# Patient Record
Sex: Female | Born: 2000 | Hispanic: Yes | Marital: Single | Smoking: Never smoker
Health system: Southern US, Community
[De-identification: ages and names within clinical notes are randomized; demographics above are authoritative.]

## PROBLEM LIST (undated history)

## (undated) DIAGNOSIS — R569 Unspecified convulsions: Secondary | ICD-10-CM

## (undated) DIAGNOSIS — R55 Syncope and collapse: Secondary | ICD-10-CM

## (undated) DIAGNOSIS — F419 Anxiety disorder, unspecified: Secondary | ICD-10-CM

## (undated) HISTORY — DX: Unspecified convulsions: R56.9

## (undated) HISTORY — DX: Anxiety disorder, unspecified: F41.9

## (undated) HISTORY — DX: Syncope and collapse: R55

---
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
## 2022-07-29 ENCOUNTER — Emergency Department (HOSPITAL_COMMUNITY): Payer: Medicaid Other

## 2022-07-29 ENCOUNTER — Encounter (HOSPITAL_COMMUNITY): Payer: Self-pay

## 2022-07-29 ENCOUNTER — Other Ambulatory Visit: Payer: Self-pay

## 2022-07-29 ENCOUNTER — Emergency Department (HOSPITAL_COMMUNITY)
Admission: EM | Admit: 2022-07-29 | Discharge: 2022-07-29 | Disposition: A | Discharge status: Home / Self Care | Payer: Medicaid Other | Attending: Emergency Medicine | Admitting: Emergency Medicine

## 2022-07-29 DIAGNOSIS — R569 Unspecified convulsions: Secondary | ICD-10-CM | POA: Diagnosis not present

## 2022-07-29 DIAGNOSIS — H1131 Conjunctival hemorrhage, right eye: Secondary | ICD-10-CM | POA: Diagnosis not present

## 2022-07-29 DIAGNOSIS — Y9301 Activity, walking, marching and hiking: Secondary | ICD-10-CM | POA: Insufficient documentation

## 2022-07-29 DIAGNOSIS — S0993XA Unspecified injury of face, initial encounter: Secondary | ICD-10-CM | POA: Diagnosis present

## 2022-07-29 DIAGNOSIS — S0081XA Abrasion of other part of head, initial encounter: Secondary | ICD-10-CM | POA: Diagnosis not present

## 2022-07-29 DIAGNOSIS — W01198A Fall on same level from slipping, tripping and stumbling with subsequent striking against other object, initial encounter: Secondary | ICD-10-CM | POA: Diagnosis not present

## 2022-07-29 DIAGNOSIS — D72829 Elevated white blood cell count, unspecified: Secondary | ICD-10-CM | POA: Insufficient documentation

## 2022-07-29 LAB — CBC WITH DIFFERENTIAL/PLATELET
Abs Immature Granulocytes: 0.03 10*3/uL (ref 0.00–0.07)
Basophils Absolute: 0.1 10*3/uL (ref 0.0–0.1)
Basophils Relative: 1 %
Eosinophils Absolute: 0.1 10*3/uL (ref 0.0–0.5)
Eosinophils Relative: 1 %
HCT: 43.8 % (ref 36.0–46.0)
Hemoglobin: 15.1 g/dL — ABNORMAL HIGH (ref 12.0–15.0)
Immature Granulocytes: 0 %
Lymphocytes Relative: 18 %
Lymphs Abs: 2 10*3/uL (ref 0.7–4.0)
MCH: 30.6 pg (ref 26.0–34.0)
MCHC: 34.5 g/dL (ref 30.0–36.0)
MCV: 88.7 fL (ref 80.0–100.0)
Monocytes Absolute: 0.6 10*3/uL (ref 0.1–1.0)
Monocytes Relative: 5 %
Neutro Abs: 8.2 10*3/uL — ABNORMAL HIGH (ref 1.7–7.7)
Neutrophils Relative %: 75 %
Platelets: 312 10*3/uL (ref 150–400)
RBC: 4.94 MIL/uL (ref 3.87–5.11)
RDW: 11.6 % (ref 11.5–15.5)
WBC: 11 10*3/uL — ABNORMAL HIGH (ref 4.0–10.5)
nRBC: 0 % (ref 0.0–0.2)

## 2022-07-29 LAB — BASIC METABOLIC PANEL
Anion gap: 10 (ref 5–15)
BUN: 9 mg/dL (ref 6–20)
CO2: 22 mmol/L (ref 22–32)
Calcium: 9.6 mg/dL (ref 8.9–10.3)
Chloride: 103 mmol/L (ref 98–111)
Creatinine, Ser: 0.71 mg/dL (ref 0.44–1.00)
GFR, Estimated: >60 mL/min (ref >60)
Glucose, Bld: 111 mg/dL — ABNORMAL HIGH (ref 70–99)
Potassium: 3.5 mmol/L (ref 3.5–5.1)
Sodium: 135 mmol/L (ref 135–145)

## 2022-07-29 LAB — URINALYSIS, ROUTINE W REFLEX MICROSCOPIC
Bacteria, UA: NONE SEEN
Bilirubin Urine: NEGATIVE
Glucose, UA: NEGATIVE mg/dL
Ketones, ur: NEGATIVE mg/dL
Leukocytes,Ua: NEGATIVE
Nitrite: NEGATIVE
Protein, ur: 100 mg/dL — AB
Specific Gravity, Urine: 1.006 (ref 1.005–1.030)
pH: 7 (ref 5.0–8.0)

## 2022-07-29 LAB — POC URINE PREG, ED: Preg Test, Ur: NEGATIVE

## 2022-07-29 LAB — TSH: TSH: 0.975 u[IU]/mL (ref 0.350–4.500)

## 2022-07-29 LAB — CBG MONITORING, ED: Glucose-Capillary: 130 mg/dL — ABNORMAL HIGH (ref 70–99)

## 2022-07-29 NOTE — ED Provider Notes (Signed)
[facility] EMERGENCY DEPARTMENT Provider Note   CSN: [id number] Arrival date & time: 07/29/22  1258     History Chief Complaint  Patient presents with   Seizures    Cindy Day is a 21 y.o. female patient who presents to the emergency department today for further evaluation of a first-time seizure that occurred just prior to arrival.  Patient states that she was walking to the bathroom when she states he slipped and fell and hit her head against something.  She does not recall what.  Shortly afterwards she became very dizzy and lightheaded and felt like she was going to pass out.  She called for her mother who came in and then saw her having convulsive type behavior for approximately 30 seconds.  Patient was postictal shortly after.  She does not recall the seizure-like activity.  Currently patient back to baseline and is complaining of a headache and lightheadedness.   Seizures      Home Medications Prior to Admission medications   Medication Sig Start Date End Date Taking? Authorizing Provider  Cholecalciferol (VITAMIN D3 PO) Take by mouth.   Yes [provider]  hydrOXYzine (ATARAX) 25 MG tablet Take 25 mg by mouth as needed for anxiety. 06/30/22  Yes [provider]  sertraline (ZOLOFT) 50 MG tablet Take 50 mg by mouth daily. 07/28/22  Yes [provider]  zinc gluconate 50 MG tablet Take 50 mg by mouth daily.   Yes [provider]      Allergies    Patient has no known allergies.    Review of Systems   Review of Systems  Neurological:  Positive for seizures.  All other systems reviewed and are negative.   Physical Exam Updated Vital Signs BP 102/64   Pulse 70   Temp 97.9 F (36.6 C) (Oral)   Resp 16   Ht 5' (1.524 m)   Wt 50.3 kg   SpO2 99%   BMI 21.68 kg/m  Physical Exam Vitals and nursing note reviewed.  Constitutional:      General: She is not in acute distress.    Appearance: Normal appearance.  HENT:      Head: Normocephalic.   Eyes:     General:        Right eye: No discharge.        Left eye: No discharge.   Cardiovascular:     Comments: Regular rate and rhythm.  S1/S2 are distinct without any evidence of murmur, rubs, or gallops.  Radial pulses are 2+ bilaterally.  Dorsalis pedis pulses are 2+ bilaterally.  No evidence of pedal edema. Pulmonary:     Comments: Clear to auscultation bilaterally.  Normal effort.  No respiratory distress.  No evidence of wheezes, rales, or rhonchi heard throughout. Abdominal:     General: Abdomen is flat. Bowel sounds are normal. There is no distension.     Tenderness: There is no abdominal tenderness. There is no guarding or rebound.  Musculoskeletal:        General: Normal range of motion.     Cervical back: Neck supple.  Skin:    General: Skin is warm and dry.     Findings: No rash.  Neurological:     General: No focal deficit present.     Mental Status: She is alert.     GCS: GCS eye subscore is 4. GCS verbal subscore is 5. GCS motor subscore is 6.     Cranial Nerves: Cranial nerves 2-12 are intact.  Sensory: Sensation is intact.     Motor: Motor function is intact.     Coordination: Coordination is intact.  Psychiatric:        Mood and Affect: Mood normal.        Behavior: Behavior normal.     ED Results / Procedures / Treatments   Labs (all labs ordered are listed, but only abnormal results are displayed) Labs Reviewed  CBC WITH DIFFERENTIAL/PLATELET - Abnormal; Notable for the following components:      Result Value   WBC 11.0 (*)    Hemoglobin 15.1 (*)    Neutro Abs 8.2 (*)    All other components within normal limits  BASIC METABOLIC PANEL - Abnormal; Notable for the following components:   Glucose, Bld 111 (*)    All other components within normal limits  URINALYSIS, ROUTINE W REFLEX MICROSCOPIC - Abnormal; Notable for the following components:   Color, Urine STRAW (*)    Hgb urine dipstick SMALL (*)    Protein, ur 100 (*)     All other components within normal limits  CBG MONITORING, ED - Abnormal; Notable for the following components:   Glucose-Capillary 130 (*)    All other components within normal limits  TSH  POC URINE PREG, ED    EKG EKG Interpretation  Date/Time:  Saturday July 29 2022 13:19:27 EDT Ventricular Rate:  74 PR Interval:  132 QRS Duration: 89 QT Interval:  393 QTC Calculation: 436 R Axis:   62 Text Interpretation: Sinus rhythm Confirmed by Davonna Belling [phone]) on 07/29/2022 3:43:33 PM  Radiology CT Cervical Spine Wo Contrast  Result Date: 07/29/2022 CLINICAL DATA:  Trauma EXAM: CT CERVICAL SPINE WITHOUT CONTRAST TECHNIQUE: Multidetector CT imaging of the cervical spine was performed without intravenous contrast. Multiplanar CT image reconstructions were also generated. RADIATION DOSE REDUCTION: This exam was performed according to the departmental dose-optimization program which includes automated exposure control, adjustment of the mA and/or kV according to patient size and/or use of iterative reconstruction technique. COMPARISON:  None Available. FINDINGS: Alignment: Alignment of posterior margins of vertebral bodies is within normal limits. There is reversal of lordosis. Skull base and vertebrae: No fracture is seen. Soft tissues and spinal canal: There is no spinal stenosis. Disc levels:  There is no narrowing of neural foramina. Upper chest: Unremarkable. Other: None. IMPRESSION: No recent fracture is seen in cervical spine. There is no spinal stenosis. There is no narrowing of neural foramina. Electronically Signed   By: Elmer Picker M.D.   On: 07/29/2022 15:00   CT Head Wo Contrast  Result Date: 07/29/2022 CLINICAL DATA:  Trauma, possible seizures EXAM: CT HEAD WITHOUT CONTRAST TECHNIQUE: Contiguous axial images were obtained from the base of the skull through the vertex without intravenous contrast. RADIATION DOSE REDUCTION: This exam was performed according to the  departmental dose-optimization program which includes automated exposure control, adjustment of the mA and/or kV according to patient size and/or use of iterative reconstruction technique. COMPARISON:  None Available. FINDINGS: Brain: No acute intracranial findings are seen. There are no signs of bleeding within the cranium. Ventricles are not dilated. There is no focal edema or mass effect. Vascular: Unremarkable. Skull: Unremarkable. Sinuses/Orbits: Unremarkable. Other: None. IMPRESSION: No acute intracranial findings are seen in noncontrast CT brain. Electronically Signed   By: Elmer Picker M.D.   On: 07/29/2022 14:57    Procedures Procedures    Medications Ordered in ED Medications - No data to display  ED Course/ Medical Decision Making/ A&P Clinical  Course as of 07/29/22 1621  Sat Jul 29, 2022  1609 CBC with Differential(!) There is evidence of leukocytosis.  This could be from seizure. [CF]  123456 Basic metabolic panel(!) Normal. [CF]  1609 POC Urine Pregnancy, ED (not at [facility]) Negative. [CF]  1609 TSH Normal. [CF]  1610 CT Cervical Spine Wo Contrast I personally ordered and interpreted a CT cervical spine without contrast and denies any evidence of fracture. [CF]  1610 CT Head Wo Contrast CT head without contrast does not reveal any intracranial hemorrhage, masses, or lesions.  I personally ordered and interpreted this study.  I do agree with the radiologist interpretation. [CF]  1618 Urinalysis, Routine w reflex microscopic Urine, Clean Catch(!) Normal. [CF]    Clinical Course User Index [CF] Hendricks Limes, PA-C                           Medical Decision Making KHALEAH KREBS is a 21 y.o. female patient who presents to the emergency department today for further evaluation of a first-time seizure.  We will get some basic labs, TSH, imaging of the head and neck considering that she had a head injury to further evaluate and look for etiologies of this versus a  seizure.  Patient seems back to her baseline.  Her neurological exam is completely normal.  No clear cause of the seizure today.  Seen this was the patient's first seizure I will send her to neurology for further evaluation.  We will hold off on medications at this time.  I requested that the patient do not drive until she follows up with neurology.  She was encouraged to return to the emergency department at anytime for worsening symptoms.  She is safe for discharge at this time.  Amount and/or Complexity of Data Reviewed Labs: ordered. Decision-making details documented in ED Course. Radiology: ordered. Decision-making details documented in ED Course.   Final Clinical Impression(s) / ED Diagnoses Final diagnoses:  Seizure ([place])    Rx / DC Orders ED Discharge Orders          Ordered    Ambulatory referral to Neurology       Comments: An appointment is requested in approximately: 1 week   07/29/22 1619              Hendricks Limes, PA-C 07/29/22 1622    Davonna Belling, MD 07/30/22 [phone]

## 2022-07-29 NOTE — ED Triage Notes (Signed)
Patient c/o possible seizure. Patient denies hx of seizures. Per patient was in bathroom when she started to feel faint. Patient called for mother. Patient seated when she started to have "convulsions." Per mother seizure like activity lasted approx 30 seconds with confusion after. Patient Alert and oriented at this time. Patient has small abrasion under right eye and reports hitting head. Patient c/o headache and some dizziness. Per patient recently had Covid.

## 2022-07-29 NOTE — ED Notes (Signed)
Pt is aware we need a urine sample and will call out when she can provide a sample.

## 2022-07-29 NOTE — ED Notes (Signed)
Pt verbalized understanding of discharge instructions. Opportunity for questions provided.  

## 2022-07-29 NOTE — Discharge Instructions (Signed)
Please follow-up with neurology.  I have given you a referral and they should contact you in the next few days.  You may also contact them if they do not call you.  I would recommend that you do not drive until you follow-up with them given that this is your first seizure.  I would continue to drink plenty of fluids and get plenty of rest.  Please return to the emergency department for any worsening symptoms you might have.

## 2022-07-31 ENCOUNTER — Encounter: Payer: Self-pay | Admitting: Neurology

## 2022-09-05 ENCOUNTER — Ambulatory Visit (INDEPENDENT_AMBULATORY_CARE_PROVIDER_SITE_OTHER): Payer: Medicaid Other | Admitting: Neurology

## 2022-09-05 ENCOUNTER — Encounter: Payer: Self-pay | Admitting: Neurology

## 2022-09-05 VITALS — BP 130/83 | HR 76 | Ht 60.0 in | Wt 115.8 lb

## 2022-09-05 DIAGNOSIS — R55 Syncope and collapse: Secondary | ICD-10-CM

## 2022-09-05 DIAGNOSIS — R569 Unspecified convulsions: Secondary | ICD-10-CM | POA: Diagnosis not present

## 2022-09-05 NOTE — Progress Notes (Signed)
NEUROLOGY CONSULTATION NOTE  Cindy Day MRN: [id number] DOB: [birth year]  Referring provider: Honor Loh, PA-C Primary care provider: Colvin Caroli, FNP  Reason for consult:  seizure-like activity   Thank you for your kind referral of Cindy Day for consultation of the above symptoms. Although her history is well known to you, please allow me to reiterate it for the purpose of our medical record. She is alone in the office today. Records and images were personally reviewed where available.   HISTORY OF PRESENT ILLNESS: This is a pleasant 21 year old left-handed woman with a history of anxiety presenting for evaluation of seizure-like activity. She reports a history of syncope since childhood, from ages 61 to 16, she had 4 syncopal episodes where she feels lightheaded/dizzy, vision and hearing goes out. The first episode was attributed to dehydration, others to anorexia, she would be anxious about going to school and vomit. Symptoms quieted down after age 35, until March 2023 while she was at [facility], she alerted her mother that she was going to faint, she felt dizzy, could not see, hearing was going out ("like buzzing"). She felt cold, clammy and sweaty. She then woke up on the floor with urinary incontinence. Her mother said she had "small convulsions" and was out for 2-3 minutes. She had an event the next day and attributed the episode to anxiety. She was started on Sertraline and Hydroxyzine which helped significantly with anxiety/panic attacks. On 07/29/22, she was on a Zoom call at home and suddenly felt the urge to use the bathroom. As she was walking to the bathroom, she slipped and fell, hitting her head. She felt a little out of it and was helped to bathroom where she felt the same symptoms of lightheadedness, blurred vision, buzzing in her ears, then she has no recollection of events. Her mother reported she was pretty still then she stiffened up with arms stretched and hands  turned in, neck started tightening with head turned to the left, making her fall and hit the counter. This lasted for 1 minute, then it took 5 minutes before she could respond. She was sore where she hit the counter. She bit her tongue, no incontinence. She went to the ER where bloodwork showed a WBC of 11, otherwise normal BMP. EKG NSR. I personally reviewed head CT without contrast, no acute changes.   She states that she has been told she zones out and would be slow to respond, she reports 3-4 times this month. Last week she felt an out of body feeling. She denies any olfactory/gustatory hallucinations, focal numbness/tingling/weakness, myoclonic jerks. Sometimes she has tingling in the tips of her fingers and nose. She denies any headaches. Sometimes she feels dizzy if standing too long, the room starts spinning so she sits down. She denies any neck/back pain, bowel/bladder dysfunction. She is studying cosmetology. She lives with her parents. She has not been driving. Mood is good, she is not having the panic attacks anymore. She gets 7-8 hours of sleep. She had a normal birth and early development.  There is no history of febrile convulsions, CNS infections such as meningitis/encephalitis, significant traumatic brain injury, neurosurgical procedures, or family history of seizures.    PAST MEDICAL HISTORY: Past Medical History:  Diagnosis Date   Abdominal pain 10-10-2010   BHT- negative   Constipation     PAST SURGICAL HISTORY: History reviewed. No pertinent surgical history.  MEDICATIONS: Current Outpatient Medications on File Prior to Visit  Medication Sig Dispense  Refill   Cholecalciferol (VITAMIN D3 PO) Take by mouth.     hydrOXYzine (ATARAX) 25 MG tablet Take 25 mg by mouth as needed for anxiety.     sertraline (ZOLOFT) 50 MG tablet Take 50 mg by mouth daily.     zinc gluconate 50 MG tablet Take 50 mg by mouth daily.     No current facility-administered medications on file prior to  visit.    ALLERGIES: No Known Allergies  FAMILY HISTORY: Family History  Problem Relation Age of Onset   Diabetes Maternal Grandmother    Hypertension Maternal Grandfather     SOCIAL HISTORY: Social History   Socioeconomic History   Marital status: Single    Spouse name: Not on file   Number of children: Not on file   Years of education: Not on file   Highest education level: Not on file  Occupational History   Not on file  Tobacco Use   Smoking status: Never   Smokeless tobacco: Never  Vaping Use   Vaping Use: Never used  Substance and Sexual Activity   Alcohol use: Never   Drug use: Never   Sexual activity: Not on file  Other Topics Concern   Not on file  Social History Narrative   Are you right handed or left handed? Left handed    Are you currently employed ? 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 No    Who lives with you? With family    What type of home do you live in: 1 story or 2 story? 1 story        Social Determinants of Radio broadcast assistant Strain: Not on file  Food Insecurity: Not on file  Transportation Needs: Not on file  Physical Activity: Not on file  Stress: Not on file  Social Connections: Not on file  Intimate Partner Violence: Not on file     PHYSICAL EXAM: Vitals:   09/05/22 1006  BP: 130/83  Pulse: 76  SpO2: 100%   General: No acute distress Head:  Normocephalic/atraumatic Skin/Extremities: No rash, no edema Neurological Exam: Mental status: alert and oriented to person, place, and time, no dysarthria or aphasia, Fund of knowledge is appropriate.  Recent and remote memory are intact, 3/3 delayed recall.  Attention and concentration are normal, 5/5 WORLD backwards.  Cranial nerves: CN I: not tested CN II: pupils equal, round, visual fields intact CN III, IV, VI:  full range of motion, no nystagmus, no ptosis CN V: facial sensation intact CN VII: upper and lower face  symmetric CN VIII: hearing intact to conversation Bulk & Tone: normal, no fasciculations. Motor: 5/5 throughout with no pronator drift. Sensation: intact to light touch, cold, pin, vibration sense.  No extinction to double simultaneous stimulation.  Romberg test negative Deep Tendon Reflexes: brisk +2 throughout, no ankle clonus, negative Hoffman sign Cerebellar: no incoordination on finger to nose testing Gait: narrow-based and steady, able to tandem walk adequately. Tremor: none   IMPRESSION: This is a pleasant 21 year old left-handed woman with a history of anxiety presenting for evaluation of seizure-like activity that occurred in March 2023 and September 2023. 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 The prodrome she describes is also suggestive of vasovagal etiology, we discussed different causes of loss of consciousness with stiffening/shaking, including convulsive syncope. She also reports episodes of zoning out and recent out of body experience,  we will order a brain MRI with and without contrast and EEG to assess for focal abnormalities that increase risk for seizures. If normal, a 72-hour EEG will be ordered to classify symptoms. She will be referred to Cardiology for syncope. [place] driving laws were discussed with the patient, and she knows to stop driving after an episode of loss of consciousness until 6 months event-free. Follow-up after tests, call for any changes.    Thank you for allowing me to participate in the care of this patient. Please do not hesitate to call for any questions or concerns.   Patrcia Dolly, M.D.  CC: Earnestine Leys, PA-C, Colvin Caroli, FNP

## 2022-09-05 NOTE — Patient Instructions (Signed)
Good to meet you.  Schedule MRI brain with and without contrast  2. Schedule EEG. If normal, we will plan for a 3-day EEG  3. Referral will be sent to Cardiology  4. It is prudent to recommend that all persons should be free of syncopal episodes for at least six months to be granted the driving privilege." ([employer], Second Edition, Medical Review Branch, Engineer, site, Division of [employer], July 2004)   5. Follow-up after tests, call for any changes

## 2022-09-11 ENCOUNTER — Encounter: Payer: Self-pay | Admitting: Neurology

## 2022-09-11 ENCOUNTER — Ambulatory Visit: Payer: Medicaid Other | Admitting: Neurology

## 2022-09-11 DIAGNOSIS — R55 Syncope and collapse: Secondary | ICD-10-CM

## 2022-09-11 DIAGNOSIS — R569 Unspecified convulsions: Secondary | ICD-10-CM | POA: Diagnosis not present

## 2022-09-11 NOTE — Progress Notes (Unsigned)
EEG complete - results pending 

## 2022-09-13 NOTE — Procedures (Signed)
ELECTROENCEPHALOGRAM REPORT  Date of Study: 09/11/2022  Patient's Name: Cindy Day MRN: [id number] Date of Birth: [birth year]  Referring Provider: Dr. Patrcia Dolly  Clinical History: This is a 21 year old woman with recurrent syncope, most recent episode associated with stiffening and head turn to the left. EEG for classification.  Medications: Sertraline, hydroxyzine  Technical Summary: A multichannel digital EEG recording measured by the international 10-20 system with electrodes applied with paste and impedances below 5000 ohms performed in our laboratory with EKG monitoring in an awake and drowsy patient.  Hyperventilation and photic stimulation were performed.  The digital EEG was referentially recorded, reformatted, and digitally filtered in a variety of bipolar and referential montages for optimal display.    Description: The patient is awake and drowsy during the recording.  During maximal wakefulness, there is a symmetric, medium voltage 10 Hz posterior dominant rhythm that attenuates with eye opening.  The record is symmetric.  During drowsiness, there is an increase in theta slowing of the background.  Sleep was not captured. Hyperventilation and photic stimulation did not elicit any abnormalities.  She reported tingling in her face and fingers with hyperventilation, no EEG or EKG changes seen. There were no epileptiform discharges or electrographic seizures seen.    EKG lead was unremarkable.  Impression: This awake and drowsy EEG is normal.    Clinical Correlation: A normal EEG does not exclude a clinical diagnosis of epilepsy.  If further clinical questions remain, prolonged EEG may be helpful.  Clinical correlation is advised.   Patrcia Dolly, M.D.

## 2022-09-21 ENCOUNTER — Telehealth: Payer: Self-pay

## 2022-09-21 DIAGNOSIS — R569 Unspecified convulsions: Secondary | ICD-10-CM

## 2022-09-21 DIAGNOSIS — R55 Syncope and collapse: Secondary | ICD-10-CM

## 2022-09-21 NOTE — Telephone Encounter (Signed)
-----   Message from Van Clines, MD sent at 09/21/2022 12:22 PM EST ----- Pls let her know EEG is normal, however it is not like a pregnancy test that is positive or negative, just a snapshot of her brain waves. Proceed with MRI brain as discussed. We had discussed doing a 3-day EEG if EEG is normal, if she would like to proceed, pls order 72-hour EEG, thanks

## 2022-09-21 NOTE — Telephone Encounter (Signed)
Pt called informed that  EEG is normal, however it is not like a pregnancy test that is positive or negative, just a snapshot of her brain waves. Proceed with MRI brain as discussed. Also had discussed doing a 3-day EEG if EEG is normal, Order placed for 72 hour EEG

## 2022-10-11 ENCOUNTER — Ambulatory Visit: Payer: Medicaid Other | Admitting: Internal Medicine
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
## 2022-10-20 ENCOUNTER — Ambulatory Visit: Payer: Medicaid Other | Attending: Internal Medicine | Admitting: Internal Medicine

## 2022-10-20 ENCOUNTER — Encounter: Payer: Self-pay | Admitting: Internal Medicine

## 2022-10-20 VITALS — BP 108/72 | HR 80 | Ht 60.0 in | Wt 116.0 lb

## 2022-10-20 DIAGNOSIS — R569 Unspecified convulsions: Secondary | ICD-10-CM | POA: Diagnosis not present

## 2022-10-20 DIAGNOSIS — R55 Syncope and collapse: Secondary | ICD-10-CM | POA: Diagnosis not present

## 2022-10-20 NOTE — Patient Instructions (Signed)
Medication Instructions:  Your physician recommends that you continue on your current medications as directed. Please refer to the Current Medication list given to you today.  *If you need a refill on your cardiac medications before your next appointment, please call your pharmacy*   Testing/Procedures: Your physician has requested that you have an echocardiogram. Echocardiography is a painless test that uses sound waves to create images of your heart. It provides your doctor with information about the size and shape of your heart and how well your heart's chambers and valves are working. This procedure takes approximately one hour. There are no restrictions for this procedure. Please do NOT wear cologne, perfume, aftershave, or lotions (deodorant is allowed). Please arrive 15 minutes prior to your appointment time.    Follow-Up: At [facility], you and your health needs are our priority.  As part of our continuing mission to provide you with exceptional heart care, we have created designated Provider Care Teams.  These Care Teams include your primary Cardiologist (physician) and Advanced Practice Providers (APPs -  Physician Assistants and Nurse Practitioners) who all work together to provide you with the care you need, when you need it.   Your next appointment:   As needed  The format for your next appointment:   In Person  Provider:   Dr. Chandrasekhar   Important Information About Sugar       

## 2022-10-26 ENCOUNTER — Ambulatory Visit (HOSPITAL_COMMUNITY)
Admission: RE | Admit: 2022-10-26 | Discharge: 2022-10-26 | Disposition: A | Discharge status: Home / Self Care | Payer: Medicaid Other | Source: Ambulatory Visit | Attending: Internal Medicine | Admitting: Internal Medicine

## 2022-10-26 DIAGNOSIS — R55 Syncope and collapse: Secondary | ICD-10-CM | POA: Diagnosis present

## 2022-10-26 LAB — ECHOCARDIOGRAM COMPLETE
Area-P 1/2: 3.72 cm2
S' Lateral: 2.7 cm

## 2022-10-26 NOTE — Progress Notes (Signed)
*  PRELIMINARY RESULTS* Echocardiogram 2D Echocardiogram has been performed.  Stacey Drain 10/26/2022, 9:08 AM

## 2022-11-07 ENCOUNTER — Ambulatory Visit (INDEPENDENT_AMBULATORY_CARE_PROVIDER_SITE_OTHER): Payer: Medicaid Other | Admitting: Neurology

## 2022-11-07 DIAGNOSIS — R569 Unspecified convulsions: Secondary | ICD-10-CM | POA: Diagnosis not present

## 2022-11-07 DIAGNOSIS — R55 Syncope and collapse: Secondary | ICD-10-CM

## 2022-11-07 NOTE — Progress Notes (Signed)
Ambulatory EEG hooked up and running. Light flashing. Push button tested. Camera and event log explained. Batteries explained. Patient understood.   

## 2022-12-07 NOTE — Procedures (Signed)
ELECTROENCEPHALOGRAM REPORT  Dates of Recording: 11/07/2022 10:45AM to 11/10/2022 10:55AM  Patient's Name: Cindy Day MRN: [id number] Date of Birth: [birth year]  Referring Provider: Dr. Ellouise Newer  Procedure: 72-hour ambulatory video EEG  History: This is a 21 year old woman with recurrent syncope, most recent episode associated with stiffening and head turn to the left. EEG for classification.   Medications: Sertraline, hydroxyzine  Technical Summary: This is a 72-hour multichannel digital video EEG recording measured by the international 10-20 system with electrodes applied with paste and impedances below 5000 ohms performed as portable with EKG monitoring.  The digital EEG was referentially recorded, reformatted, and digitally filtered in a variety of bipolar and referential montages for optimal display.    DESCRIPTION OF RECORDING: During maximal wakefulness, the background activity consisted of a symmetric 10-11 Hz posterior dominant rhythm which was reactive to eye opening.  There were no epileptiform discharges or focal slowing seen in wakefulness.  During the recording, the patient progresses through wakefulness, drowsiness, and Stage 2 sleep.  Again, there were no epileptiform discharges seen.  Events: On 1/2 at 1410 hours, she has a frontal headache, worse on the right, dizziness for 5 minutes. Patient not on video. Electrographically, there were no EEG or EKG changes seen.  On 1/3 at 1341 hours, she had a hard time breathing and became dizzy for about 20 minutes. Patient is seen fixing clothes then sits down. No EEG changes seen. EKG showed sinus tachycardia at 120 bpm.  On 1/3 at 2033 hours, she reports heart pounding and shortness of breath for 10 minutes. She is lying on the bed and sits up to write on event diary. Electrographically, there were no EEG or EKG changes seen. HR 84 bpm.  On 1/4 at 1658 hours, she feels like she is burning up. Patient not on video.  Electrographically, there were no EEG or EKG changes seen. HR 90 bpm.   There were no electrographic seizures seen.  EKG lead was unremarkable.  IMPRESSION: This 72-hour ambulatory video EEG study is normal.    CLINICAL CORRELATION: A normal EEG does not exclude a clinical diagnosis of epilepsy. Episodes of dizziness, difficulty breathing, palpitations, did not show any EEG changes. During one episode, HR was 120bpm. If further clinical questions remain, inpatient video EEG monitoring may be helpful.   Ellouise Newer, M.D.

## 2022-12-15 ENCOUNTER — Telehealth: Payer: Self-pay

## 2022-12-15 NOTE — Telephone Encounter (Signed)
Pt called an informed that the prolonged EEG did not show any seizure activity. Has she had the MRI brain scheduled? Pt was given the number to [facility]

## 2022-12-15 NOTE — Telephone Encounter (Signed)
-----   Message from Cameron Sprang, MD sent at 12/11/2022 12:59 PM EST ----- Pls let her know the prolonged EEG did not show any seizure activity. Has she had the MRI brain scheduled? Thanks

## 2023-01-12 ENCOUNTER — Ambulatory Visit
Admission: RE | Admit: 2023-01-12 | Discharge: 2023-01-12 | Disposition: A | Discharge status: Home / Self Care | Payer: Medicaid Other | Source: Ambulatory Visit | Attending: Neurology | Admitting: Neurology

## 2023-01-12 DIAGNOSIS — R55 Syncope and collapse: Secondary | ICD-10-CM

## 2023-01-12 DIAGNOSIS — R569 Unspecified convulsions: Secondary | ICD-10-CM

## 2023-01-12 MED ORDER — GADOPICLENOL 0.5 MMOL/ML IV SOLN
5.0000 mL | Freq: Once | INTRAVENOUS | Status: AC | PRN
  Administered 2023-01-12: 5 mL via INTRAVENOUS

## 2023-01-19 ENCOUNTER — Telehealth: Payer: Self-pay

## 2023-01-19 NOTE — Telephone Encounter (Signed)
-----   Message from Cameron Sprang, MD sent at 01/15/2023 10:15 AM EDT ----- Pls let her know brain MRI is normal, no tumor, stroke, or bleed. How is she feeling? If feeling fine, f/u as scheduled in July, thanks

## 2023-01-19 NOTE — Telephone Encounter (Signed)
Pt called an informed that brain MRI is normal, no tumor, stroke, or bleed. How is she feeling? She is feeling fine, no episodes. Pt will follow up in July

## 2023-05-08 ENCOUNTER — Encounter: Payer: Self-pay | Admitting: Neurology

## 2023-05-08 ENCOUNTER — Ambulatory Visit (INDEPENDENT_AMBULATORY_CARE_PROVIDER_SITE_OTHER): Payer: Medicaid Other | Admitting: Neurology

## 2023-05-08 VITALS — BP 118/73 | HR 79 | Ht 60.0 in | Wt 123.0 lb

## 2023-05-08 DIAGNOSIS — R55 Syncope and collapse: Secondary | ICD-10-CM | POA: Diagnosis not present

## 2023-05-08 NOTE — Progress Notes (Signed)
NEUROLOGY FOLLOW UP OFFICE NOTE  Cindy Day [id number] 2001-06-07  HISTORY OF PRESENT ILLNESS: I had the pleasure of seeing Cindy Day in follow-up in the neurology clinic on 05/08/2023.  The patient was last seen 9 months ago for episodes of loss of consciousness with stiffening.  She is alone in the office today. Records and images were personally reviewed where available.  I personally reviewed MRI brain with and without contrast done 01/2023 which was normal. Her routine and 72-hour EEG were normal, typical events not captured. Episodes of headache and dizziness did not show EEG correlate. She was kindly seen by Cardiology and had a normal echocardiogram. She denies any further episodes of loss of consciousness since 07/2022. No staring/unresponsive episodes, gaps in time, olfactory/gustatory hallucinations, focal numbness/tingling/weakness, myoclonic jerks. Sometimes she zones out but more of due to inattention. She denies any significant headaches, vision changes, no falls. She may get dizzy when it is hot. She has been keeping hydrated. She gets 7-8 hours of sleep.   History on Initial Assessment 09/05/2022: This is a pleasant 23 year old left-handed woman with a history of anxiety presenting for evaluation of seizure-like activity. She reports a history of syncope since childhood, from ages 41 to 60, she had 4 syncopal episodes where she feels lightheaded/dizzy, vision and hearing goes out. The first episode was attributed to dehydration, others to anorexia, she would be anxious about going to school and vomit. Symptoms quieted down after age 65, until March 2023 while she was at [facility], she alerted her mother that she was going to faint, she felt dizzy, could not see, hearing was going out ("like buzzing"). She felt cold, clammy and sweaty. She then woke up on the floor with urinary incontinence. Her mother said she had "small convulsions" and was out for 2-3 minutes. She had an event  the next day and attributed the episode to anxiety. She was started on Sertraline and Hydroxyzine which helped significantly with anxiety/panic attacks. On 07/29/22, she was on a Zoom call at home and suddenly felt the urge to use the bathroom. As she was walking to the bathroom, she slipped and fell, hitting her head. She felt a little out of it and was helped to bathroom where she felt the same symptoms of lightheadedness, blurred vision, buzzing in her ears, then she has no recollection of events. Her mother reported she was pretty still then she stiffened up with arms stretched and hands turned in, neck started tightening with head turned to the left, making her fall and hit the counter. This lasted for 1 minute, then it took 5 minutes before she could respond. She was sore where she hit the counter. She bit her tongue, no incontinence. She went to the ER where bloodwork showed a WBC of 11, otherwise normal BMP. EKG NSR. I personally reviewed head CT without contrast, no acute changes.   She states that she has been told she zones out and would be slow to respond, she reports 3-4 times this month. Last week she felt an out of body feeling. She denies any olfactory/gustatory hallucinations, focal numbness/tingling/weakness, myoclonic jerks. Sometimes she has tingling in the tips of her fingers and nose. She denies any headaches. Sometimes she feels dizzy if standing too long, the room starts spinning so she sits down. She denies any neck/back pain, bowel/bladder dysfunction. She is studying cosmetology. She lives with her parents. She has not been driving. Mood is good, she is not having the panic attacks  anymore. She gets 7-8 hours of sleep. She had a normal birth and early development.  There is no history of febrile convulsions, CNS infections such as meningitis/encephalitis, significant traumatic brain injury, neurosurgical procedures, or family history of seizures.  PAST MEDICAL HISTORY: Past Medical  History:  Diagnosis Date   Abdominal pain 10/10/2010   BHT- negative   Anxiety    Constipation    Seizure (HCC)    Syncope     MEDICATIONS: Current Outpatient Medications on File Prior to Visit  Medication Sig Dispense Refill   Cholecalciferol (VITAMIN D3 PO) Take by mouth.     hydrOXYzine (ATARAX) 25 MG tablet Take 25 mg by mouth as needed for anxiety.     sertraline (ZOLOFT) 50 MG tablet Take 50 mg by mouth daily.     zinc gluconate 50 MG tablet Take 50 mg by mouth daily.     No current facility-administered medications on file prior to visit.    ALLERGIES: Allergies  Allergen Reactions   Lavender Oil Nausea And Vomiting    FAMILY HISTORY: Family History  Problem Relation Age of Onset   Diabetes Maternal Grandmother    Hypertension Maternal Grandfather     SOCIAL HISTORY: Social History   Socioeconomic History   Marital status: Single    Spouse name: Not on file   Number of children: Not on file   Years of education: Not on file   Highest education level: Not on file  Occupational History   Not on file  Tobacco Use   Smoking status: Never   Smokeless tobacco: Never  Vaping Use   Vaping Use: Never used  Substance and Sexual Activity   Alcohol use: Never   Drug use: Never   Sexual activity: Not on file  Other Topics Concern   Not on file  Social History Narrative   Are you right handed or left handed? Left handed    Are you currently employed ? 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 No    Who lives with you? With family    What type of home do you live in: 1 story or 2 story? 1 story        Social Determinants of Corporate investment banker Strain: Not on file  Food Insecurity: Not on file  Transportation Needs: Not on file  Physical Activity: Not on file  Stress: Not on file  Social Connections: Not on file  Intimate Partner Violence: Not on file     PHYSICAL EXAM: Vitals:   05/08/23 0821  BP:  118/73  Pulse: 79  SpO2: 98%   General: No acute distress Head:  Normocephalic/atraumatic Skin/Extremities: No rash, no edema Neurological Exam: alert and awake. No aphasia or dysarthria. Fund of knowledge is appropriate. Attention and concentration are normal.   Cranial nerves: Pupils equal, round. Extraocular movements intact with no nystagmus. Visual fields full.  No facial asymmetry.  Motor: Bulk and tone normal, muscle strength 5/5 throughout with no pronator drift.   Finger to nose testing intact.  Gait narrow-based and steady, able to tandem walk adequately.  Romberg negative.   IMPRESSION: This is a pleasant 22 yo LH woman with a history of anxiety with recurrent syncopal episodes with prodrome suggestive of vasovagal syncope. There have been 2 of these with stiffening/shaking, likely convulsive syncope. Brain MRI and 72-hour EEG normal. She has been seen by Cardiology with normal echocardiogram. We discussed continued  increased hydration, she was given counterpressure maneuvers to try if she feels presyncopal. Follow-up as needed, she knows to call for any recurrence or change in symptoms.   Thank you for allowing me to participate in her care.  Please do not hesitate to call for any questions or concerns.   Patrcia Dolly, M.D.   CC: Colvin Caroli, FNP

## 2023-05-08 NOTE — Patient Instructions (Signed)
Good to see you doing well. Continue keeping hydrated, try counterpressure maneuvers if you start feeling dizzy. Follow-up as needed, call for any changes.

## 2023-06-27 ENCOUNTER — Emergency Department (HOSPITAL_COMMUNITY)
Admission: EM | Admit: 2023-06-27 | Discharge: 2023-06-28 | Disposition: A | Discharge status: Home / Self Care | Payer: Medicaid Other | Attending: Student | Admitting: Student

## 2023-06-27 ENCOUNTER — Emergency Department (HOSPITAL_COMMUNITY)
Admission: EM | Admit: 2023-06-27 | Discharge: 2023-06-27 | Disposition: A | Discharge status: Home / Self Care | Payer: Medicaid Other | Attending: Emergency Medicine | Admitting: Emergency Medicine

## 2023-06-27 ENCOUNTER — Other Ambulatory Visit: Payer: Self-pay

## 2023-06-27 ENCOUNTER — Emergency Department (HOSPITAL_COMMUNITY): Payer: Medicaid Other

## 2023-06-27 ENCOUNTER — Encounter (HOSPITAL_COMMUNITY): Payer: Self-pay

## 2023-06-27 DIAGNOSIS — S82841A Displaced bimalleolar fracture of right lower leg, initial encounter for closed fracture: Secondary | ICD-10-CM | POA: Insufficient documentation

## 2023-06-27 DIAGNOSIS — S99911A Unspecified injury of right ankle, initial encounter: Secondary | ICD-10-CM | POA: Diagnosis present

## 2023-06-27 DIAGNOSIS — Y9301 Activity, walking, marching and hiking: Secondary | ICD-10-CM | POA: Insufficient documentation

## 2023-06-27 DIAGNOSIS — X58XXXA Exposure to other specified factors, initial encounter: Secondary | ICD-10-CM | POA: Insufficient documentation

## 2023-06-27 DIAGNOSIS — M7989 Other specified soft tissue disorders: Secondary | ICD-10-CM | POA: Insufficient documentation

## 2023-06-27 DIAGNOSIS — X500XXA Overexertion from strenuous movement or load, initial encounter: Secondary | ICD-10-CM | POA: Insufficient documentation

## 2023-06-27 MED ORDER — NAPROXEN 500 MG PO TABS: 500.0000 mg | ORAL_TABLET | Freq: Two times a day (BID) | ORAL | 0 refills | Status: AC

## 2023-06-27 MED ORDER — OXYCODONE-ACETAMINOPHEN 5-325 MG PO TABS
2.0000 | ORAL_TABLET | Freq: Once | ORAL | Status: AC
  Administered 2023-06-27: 2 via ORAL
  Filled 2023-06-27: qty 2

## 2023-06-27 MED ORDER — MORPHINE SULFATE (PF) 4 MG/ML IV SOLN
4.0000 mg | Freq: Once | INTRAVENOUS | Status: DC
  Filled 2023-06-27: qty 1

## 2023-06-27 MED ORDER — NAPROXEN 250 MG PO TABS
500.0000 mg | ORAL_TABLET | Freq: Once | ORAL | Status: AC
  Administered 2023-06-27: 500 mg via ORAL
  Filled 2023-06-27: qty 2

## 2023-06-27 MED ORDER — OXYCODONE-ACETAMINOPHEN 5-325 MG PO TABS: 1.0000 | ORAL_TABLET | Freq: Four times a day (QID) | ORAL | 0 refills | Status: AC | PRN

## 2023-06-27 MED ORDER — KETOROLAC TROMETHAMINE 15 MG/ML IJ SOLN
15.0000 mg | Freq: Once | INTRAMUSCULAR | Status: DC
  Filled 2023-06-27: qty 1

## 2023-06-27 NOTE — ED Notes (Signed)
Ice pack applied to RLE and extremity elevated.

## 2023-06-27 NOTE — ED Notes (Signed)
Patient Alert and oriented to baseline. Stable and ambulatory to baseline. Patient verbalized understanding of the discharge instructions.  Patient belongings were taken by the patient.   

## 2023-06-27 NOTE — ED Provider Notes (Signed)
Cindy Day EMERGENCY DEPARTMENT AT [facility] Provider Note   CSN: [id number] Arrival date & time: 06/27/23  1633     History  Chief Complaint  Patient presents with   Ankle Injury    Cindy Day is a 22 y.o. female.  HPI   This patient is a 22 year old female no significant medical history presents after injuring her right ankle when she was walking, twisted her ankle and felt pain with swelling, paramedics have transported the patient, vital signs unremarkable, no medications prehospital  Home Medications Prior to Admission medications   Medication Sig Start Date End Date Taking? Authorizing Provider  naproxen (NAPROSYN) 500 MG tablet Take 1 tablet (500 mg total) by mouth 2 (two) times daily. 06/27/23  Yes Eber Hong, MD  oxyCODONE-acetaminophen (PERCOCET/ROXICET) 5-325 MG tablet Take 1 tablet by mouth every 6 (six) hours as needed for severe pain. 06/27/23  Yes Eber Hong, MD  Cholecalciferol (VITAMIN D3 PO) Take by mouth.    [provider]  hydrOXYzine (ATARAX) 25 MG tablet Take 25 mg by mouth as needed for anxiety. 06/30/22   [provider]  sertraline (ZOLOFT) 50 MG tablet Take 50 mg by mouth daily. 07/28/22   [provider]  zinc gluconate 50 MG tablet Take 50 mg by mouth daily.    [provider]      Allergies    Kiwi extract and Lavender oil    Review of Systems   Review of Systems  Musculoskeletal:  Positive for arthralgias and joint swelling.  Neurological:  Negative for weakness and numbness.    Physical Exam Updated Vital Signs BP 100/78 (BP Location: Right Arm)   Pulse 85   Temp 98.3 F (36.8 C) (Oral)   Resp 14   Ht 1.524 m (5')   Wt 55.8 kg   SpO2 100%   BMI 24.02 kg/m  Physical Exam Vitals and nursing note reviewed.  Constitutional:      Appearance: She is well-developed. She is not diaphoretic.  HENT:     Head: Normocephalic and atraumatic.  Eyes:     General:        Right eye: No  discharge.        Left eye: No discharge.     Conjunctiva/sclera: Conjunctivae normal.  Pulmonary:     Effort: Pulmonary effort is normal. No respiratory distress.  Musculoskeletal:     Comments: Tenderness over the right lateral malleolus with associated swelling, normal pulses no pain at the base of the fifth metatarsal no midfoot pain, no medial malleolus pain, normal range of motion of the knee and the hip on the right side.  Skin:    General: Skin is warm and dry.     Findings: No erythema or rash.  Neurological:     Mental Status: She is alert.     Coordination: Coordination normal.     Comments: Straight leg raise unremarkable, able to do perform all of those actions, decreased range of motion of the ankle secondary to pain patella normal sensation of the right lower extremity.     ED Results / Procedures / Treatments   Labs (all labs ordered are listed, but only abnormal results are displayed) Labs Reviewed - No data to display  EKG None  Radiology DG Ankle Complete Right  Result Date: 06/27/2023 CLINICAL DATA:  Pain after trauma EXAM: RIGHT ANKLE - COMPLETE 3 VIEW COMPARISON:  None Available. FINDINGS: Global soft tissue swelling about the ankle. There is a minimally  angulated fracture of the distal fibular metaphysis. There also fractures along the medial malleolus in the posterior aspect of the distal tibia on the lateral view. There is slight asymmetry of the ankle mortise as well. Please correlate for ligamentous abnormality. No additional fracture or dislocation. Preserved bone mineralization. IMPRESSION: Soft tissue swelling with fractures involving the distal fibular metaphysis, medial malleolus in the posterior distal tibia at the level of the joint space. Asymmetry of the ankle mortise. Please correlate for ligamentous abnormality and instability as well. Electronically Signed   By: Karen Kays M.D.   On: 06/27/2023 17:32    Procedures .Splint  Application  Date/Time: 06/27/2023 6:00 PM  Performed by: Eber Hong, MD Authorized by: Eber Hong, MD   Consent:    Consent obtained:  Verbal   Consent given by:  Patient   Risks, benefits, and alternatives were discussed: yes     Risks discussed:  Discoloration, numbness, pain and swelling   Alternatives discussed:  No treatment, delayed treatment and alternative treatment Universal protocol:    Procedure explained and questions answered to patient or proxy's satisfaction: yes     Immediately prior to procedure a time out was called: yes     Patient identity confirmed:  Verbally with patient Pre-procedure details:    Distal neurologic exam:  Normal   Distal perfusion: distal pulses strong and brisk capillary refill   Procedure details:    Location:  Leg   Leg location:  R lower leg   Splint type:  Short leg and ankle stirrup   Supplies:  Fiberglass, cotton padding and elastic bandage   Attestation: Splint applied and adjusted personally by me   Post-procedure details:    Distal neurologic exam:  Normal   Distal perfusion: distal pulses strong, brisk capillary refill and unchanged     Procedure completion:  Tolerated well, no immediate complications Comments:           Medications Ordered in ED Medications  naproxen (NAPROSYN) tablet 500 mg (500 mg Oral Given 06/27/23 1657)  oxyCODONE-acetaminophen (PERCOCET/ROXICET) 5-325 MG per tablet 2 tablet (2 tablets Oral Given 06/27/23 1720)    ED Course/ Medical Decision Making/ A&P Clinical Course as of 06/27/23 1801  Wed Jun 27, 2023  1756 I discussed the care with Dr. Dallas Schimke of the orthopedic service who is agreeable to the immobilization nonweightbearing RICE therapy and follow-up next week on Tuesday or Wednesday. [BM]    Clinical Course User Index [BM] Eber Hong, MD                                 Medical Decision Making Amount and/or Complexity of Data Reviewed Radiology: ordered.  Risk Prescription drug  management.   X-ray ankle, anti-inflammatories, RICE therapy, patient otherwise unremarkable with no associated injuries.  I have discussed with the patient at the bedside the results, and the meaning of these results.  They have expressed her understanding to the need for follow-up with primary care physician        Final Clinical Impression(s) / ED Diagnoses Final diagnoses:  Closed bimalleolar fracture of right ankle, initial encounter    Rx / DC Orders ED Discharge Orders          Ordered    naproxen (NAPROSYN) 500 MG tablet  2 times daily        06/27/23 1800    oxyCODONE-acetaminophen (PERCOCET/ROXICET) 5-325 MG tablet  Every 6 hours PRN  06/27/23 1800              Eber Hong, MD 06/27/23 [phone]

## 2023-06-27 NOTE — ED Triage Notes (Signed)
Pt arrives via [facility] with right ankle pain and swelling after a fall. Denies hitting head, no LOC

## 2023-06-27 NOTE — ED Notes (Signed)
MD at bedside splinting RLE.

## 2023-06-27 NOTE — ED Triage Notes (Signed)
Patient reports persistent pain at right ankle with swelling this evening , seen at [facility] ER this afternoon diagnosed with ankle fracture , prescribed with oral analgesic with no relief .

## 2023-06-27 NOTE — Discharge Instructions (Signed)
Please call the office for the next available appointment early next week.  Naprosyn twice a day for pain, keep the foot elevated, ice packs intermittently, I will also send some strong pain medicine to the pharmacy, this is called Percocet, it is oxycodone with Tylenol, take 1 tablet every 6 hours as needed for pain, this may cause nausea, it may cause constipation so take a stool softener with it.  ER for severe worsening pain swelling or changes in color or temperature of your toes.

## 2023-06-28 ENCOUNTER — Emergency Department (HOSPITAL_COMMUNITY): Payer: Medicaid Other

## 2023-06-28 MED ORDER — KETOROLAC TROMETHAMINE 15 MG/ML IJ SOLN
15.0000 mg | Freq: Once | INTRAMUSCULAR | Status: AC
  Administered 2023-06-28: 15 mg via INTRAMUSCULAR

## 2023-06-28 MED ORDER — ONDANSETRON HCL 4 MG/2ML IJ SOLN
4.0000 mg | Freq: Once | INTRAMUSCULAR | Status: AC
  Administered 2023-06-28: 4 mg via INTRAVENOUS
  Filled 2023-06-28: qty 2

## 2023-06-28 MED ORDER — MORPHINE SULFATE (PF) 4 MG/ML IV SOLN
3.0000 mg | Freq: Once | INTRAVENOUS | Status: AC
  Administered 2023-06-28: 3 mg via INTRAVENOUS

## 2023-06-28 MED ORDER — LIDOCAINE-EPINEPHRINE (PF) 2 %-1:200000 IJ SOLN
10.0000 mL | Freq: Once | INTRAMUSCULAR | Status: AC
  Administered 2023-06-28: 10 mL via INTRADERMAL
  Filled 2023-06-28: qty 20

## 2023-06-28 MED ORDER — ACETAMINOPHEN 500 MG PO TABS: 1000.0000 mg | ORAL_TABLET | Freq: Three times a day (TID) | ORAL | 0 refills | Status: AC

## 2023-06-28 NOTE — Progress Notes (Signed)
Orthopedic Tech Progress Note Patient Details:  Cindy Day [facility] 2001/04/16 [id number]  Ortho Devices Type of Ortho Device: Short leg splint, Stirrup splint Ortho Device/Splint Location: RUE Ortho Device/Splint Interventions: Ordered, Application, Adjustment   Post Interventions Patient Tolerated: Well  Al Decant 06/28/2023, 1:31 AM

## 2023-06-28 NOTE — Discharge Instructions (Signed)
For pain:  - Acetaminophen 1000 mg three times daily (every 8 hours) - Naproxen 2 times daily (every 12 hours) - oxycodone for breakthrough pain only 

## 2023-06-28 NOTE — ED Provider Notes (Signed)
[place] EMERGENCY DEPARTMENT AT [facility] Provider Note  CSN: [id number] Arrival date & time: 06/27/23 2303  Chief Complaint(s) Right Ankle Fracture ( Persistent Pain )   HPI Cindy Day is a 22 y.o. female with PMH anxiety, seizure who presents emergency room for evaluation of persistent pain after a right ankle fracture.  Patient was seen at [facility] this morning showing a bimalleolar ankle fracture.  Patient splinted and discharged with pain control.  Patient states that approximately 9 PM tonight she had severe worsening of her right ankle pain and arrives tearful.  She has been taking her naproxen and oxycodone without improvement.  Endorses some mild tingling at the toes.  Denies chest pain, shortness of breath, abdominal pain, nausea, vomiting or other systemic symptoms.   Past Medical History Past Medical History:  Diagnosis Date   Abdominal pain 10/10/2010   BHT- negative   Anxiety    Constipation    Seizure (HCC)    Syncope    Patient Active Problem List   Diagnosis Date Noted   Syncope and collapse 10/20/2022   Seizure (HCC) 10/20/2022   Anxiety 11/15/2021   Home Medication(s) Prior to Admission medications   Medication Sig Start Date End Date Taking? Authorizing Provider  Cholecalciferol (VITAMIN D3 PO) Take by mouth.    [provider]  hydrOXYzine (ATARAX) 25 MG tablet Take 25 mg by mouth as needed for anxiety. 06/30/22   [provider]  naproxen (NAPROSYN) 500 MG tablet Take 1 tablet (500 mg total) by mouth 2 (two) times daily. 06/27/23   Eber Hong, MD  oxyCODONE-acetaminophen (PERCOCET/ROXICET) 5-325 MG tablet Take 1 tablet by mouth every 6 (six) hours as needed for severe pain. 06/27/23   Eber Hong, MD  sertraline (ZOLOFT) 50 MG tablet Take 50 mg by mouth daily. 07/28/22   [provider]  zinc gluconate 50 MG tablet Take 50 mg by mouth daily.    [provider]                                                                                                                                     Past Surgical History No past surgical history on file. Family History Family History  Problem Relation Age of Onset   Diabetes Maternal Grandmother    Hypertension Maternal Grandfather     Social History Social History   Tobacco Use   Smoking status: Never   Smokeless tobacco: Never  Vaping Use   Vaping status: Never Used  Substance Use Topics   Alcohol use: Never   Drug use: Never   Allergies Kiwi extract and Lavender oil  Review of Systems Review of Systems  Musculoskeletal:  Positive for arthralgias and joint swelling.    Physical Exam Vital Signs  I have reviewed the triage vital signs BP 138/78   Pulse 100   Temp 98 F (36.7 C) (Oral)   Resp  16   SpO2 100%   Physical Exam Vitals and nursing note reviewed.  Constitutional:      General: She is not in acute distress.    Appearance: She is well-developed.  HENT:     Head: Normocephalic and atraumatic.  Eyes:     Conjunctiva/sclera: Conjunctivae normal.  Cardiovascular:     Rate and Rhythm: Normal rate and regular rhythm.     Heart sounds: No murmur heard. Pulmonary:     Effort: Pulmonary effort is normal. No respiratory distress.     Breath sounds: Normal breath sounds.  Abdominal:     Palpations: Abdomen is soft.     Tenderness: There is no abdominal tenderness.  Musculoskeletal:        General: Swelling, tenderness, deformity and signs of injury present.     Cervical back: Neck supple.  Skin:    General: Skin is warm and dry.     Capillary Refill: Capillary refill takes less than 2 seconds.  Neurological:     Mental Status: She is alert.  Psychiatric:        Mood and Affect: Mood normal.     ED Results and Treatments Labs (all labs ordered are listed, but only abnormal results are displayed) Labs Reviewed - No data to display                                                                                                                         Radiology DG Ankle Complete Right  Result Date: 06/27/2023 CLINICAL DATA:  Pain after trauma EXAM: RIGHT ANKLE - COMPLETE 3 VIEW COMPARISON:  None Available. FINDINGS: Global soft tissue swelling about the ankle. There is a minimally angulated fracture of the distal fibular metaphysis. There also fractures along the medial malleolus in the posterior aspect of the distal tibia on the lateral view. There is slight asymmetry of the ankle mortise as well. Please correlate for ligamentous abnormality. No additional fracture or dislocation. Preserved bone mineralization. IMPRESSION: Soft tissue swelling with fractures involving the distal fibular metaphysis, medial malleolus in the posterior distal tibia at the level of the joint space. Asymmetry of the ankle mortise. Please correlate for ligamentous abnormality and instability as well. Electronically Signed   By: Karen Kays M.D.   On: 06/27/2023 17:32    Pertinent labs & imaging results that were available during my care of the patient were reviewed by me and considered in my medical decision making (see MDM for details).  Medications Ordered in ED Medications  morphine (PF) 4 MG/ML injection 3 mg (3 mg Intravenous Given 06/28/23 0021)  ondansetron (ZOFRAN) injection 4 mg (4 mg Intravenous Given 06/28/23 0021)  ketorolac (TORADOL) 15 MG/ML injection 15 mg (15 mg Intramuscular Given 06/28/23 0021)  Procedures .Ortho Injury Treatment  Date/Time: 06/28/2023 4:22 AM  Performed by: Glendora Score, MD Authorized by: Glendora Score, MD   Consent:    Consent obtained:  Verbal   Consent given by:  Patient   Risks discussed:  Fracture, nerve damage, restricted joint movement and vascular damage   Alternatives discussed:  No treatment and alternative treatmentInjury location:  ankle Location details: right ankle Injury type: fracture Pre-procedure distal perfusion: normal Pre-procedure neurological function: normal Pre-procedure range of motion: reduced Anesthesia: hematoma block  Anesthesia: Local anesthesia used: yes Local Anesthetic: lidocaine 2% with epinephrine Anesthetic total: 8 mL Immobilization: splint Splint type: ankle stirrup Splint Applied by: Ortho Tech Supplies used: Ortho-Glass Post-procedure distal perfusion: normal Post-procedure neurological function: normal Post-procedure range of motion: unchanged     (including critical care time)  Medical Decision Making / ED Course   This patient presents to the ED for concern of ankle pain, this involves an extensive number of treatment options, and is a complaint that carries with it a high risk of complications and morbidity.  The differential diagnosis includes fracture, ligamentous injury, hematoma, dislocation, compression from splint  MDM: Patient seen emergency room for evaluation of ankle pain.  Physical exam with splint in place but patient is splinted in plantarflexion.  Sensation intact.  Pulses intact.  Repeat x-ray showing no new fracture or significant displacement.  Splint was taken down and the ankle was examined.  I performed a hematoma block and attempted to splint the patient in a position of neutral comfort.  Patient pain controlled on reevaluation symptoms significantly improved.  At this time she does not meet inpatient criteria for admission and will follow-up with Dr. Dallas Schimke of orthopedic surgery in [place].  Patient discharged   Additional history obtained: -Additional history obtained from multiple family members -External records from outside source obtained and reviewed including: Chart review including previous notes, labs, imaging, consultation notes     Imaging Studies ordered: I ordered imaging studies including x-ray ankle I independently visualized and  interpreted imaging. I agree with the radiologist interpretation   Medicines ordered and prescription drug management: Meds ordered this encounter  Medications   DISCONTD: morphine (PF) 4 MG/ML injection 4 mg   DISCONTD: ketorolac (TORADOL) 15 MG/ML injection 15 mg   morphine (PF) 4 MG/ML injection 3 mg   ondansetron (ZOFRAN) injection 4 mg   ketorolac (TORADOL) 15 MG/ML injection 15 mg    -I have reviewed the patients home medicines and have made adjustments as needed  Critical interventions none   Social Determinants of Health:  Factors impacting patients care include: none   Reevaluation: After the interventions noted above, I reevaluated the patient and found that they have :improved  Co morbidities that complicate the patient evaluation  Past Medical History:  Diagnosis Date   Abdominal pain 10/10/2010   BHT- negative   Anxiety    Constipation    Seizure (HCC)    Syncope       Dispostion: I considered admission for this patient, but at this time she does not meet inpatient criteria for admission she is safe for discharge with outpatient follow-up     Final Clinical Impression(s) / ED Diagnoses Final diagnoses:  None     @PCDICTATION @    Evelynne Spiers, Wyn Forster, MD 06/28/23 0425

## 2023-06-28 NOTE — ED Notes (Signed)
Patients leg with persistent pain, leg splint looks to be too tight on her leg. 4 separate cobands where undone and rebandaged. Patient with immediate relief

## 2023-07-04 ENCOUNTER — Ambulatory Visit: Payer: Medicaid Other | Admitting: Orthopedic Surgery
